# Patient Record
Sex: Male | Born: 1975 | Race: Black or African American | Hispanic: No | Marital: Single | Smoking: Never smoker
Health system: Southern US, Community
[De-identification: ages and names within clinical notes are randomized; demographics above are authoritative.]

## PROBLEM LIST (undated history)

## (undated) HISTORY — PX: APPENDECTOMY: SHX54

---

## 2005-08-06 ENCOUNTER — Emergency Department: Payer: Self-pay | Admitting: Emergency Medicine

## 2008-09-26 ENCOUNTER — Emergency Department: Payer: Self-pay | Admitting: Emergency Medicine

## 2010-03-17 ENCOUNTER — Emergency Department: Payer: Self-pay | Admitting: Emergency Medicine

## 2010-03-21 ENCOUNTER — Emergency Department: Payer: Self-pay | Admitting: Emergency Medicine

## 2010-03-25 ENCOUNTER — Ambulatory Visit: Payer: Self-pay | Admitting: Urology

## 2018-05-07 ENCOUNTER — Other Ambulatory Visit: Payer: Self-pay

## 2018-05-07 ENCOUNTER — Encounter: Payer: Self-pay | Admitting: Emergency Medicine

## 2018-05-07 ENCOUNTER — Emergency Department
Admission: EM | Admit: 2018-05-07 | Discharge: 2018-05-07 | Disposition: A | Discharge status: Home / Self Care | Payer: BLUE CROSS/BLUE SHIELD | Attending: Emergency Medicine | Admitting: Emergency Medicine

## 2018-05-07 ENCOUNTER — Emergency Department: Payer: BLUE CROSS/BLUE SHIELD

## 2018-05-07 DIAGNOSIS — R109 Unspecified abdominal pain: Secondary | ICD-10-CM | POA: Diagnosis present

## 2018-05-07 DIAGNOSIS — N201 Calculus of ureter: Secondary | ICD-10-CM | POA: Insufficient documentation

## 2018-05-07 LAB — URINALYSIS, COMPLETE (UACMP) WITH MICROSCOPIC
Bacteria, UA: NONE SEEN
Bilirubin Urine: NEGATIVE
Glucose, UA: NEGATIVE mg/dL
HGB URINE DIPSTICK: NEGATIVE
Ketones, ur: NEGATIVE mg/dL
LEUKOCYTES UA: NEGATIVE
Nitrite: NEGATIVE
PH: 7 (ref 5.0–8.0)
Protein, ur: NEGATIVE mg/dL
SPECIFIC GRAVITY, URINE: 1.017 (ref 1.005–1.030)
Squamous Epithelial / LPF: NONE SEEN (ref 0–5)
WBC, UA: NONE SEEN WBC/hpf (ref 0–5)

## 2018-05-07 LAB — COMPREHENSIVE METABOLIC PANEL
ALK PHOS: 70 U/L (ref 38–126)
ALT: 21 U/L (ref 0–44)
AST: 34 U/L (ref 15–41)
Albumin: 4.4 g/dL (ref 3.5–5.0)
Anion gap: 8 (ref 5–15)
BILIRUBIN TOTAL: 0.8 mg/dL (ref 0.3–1.2)
BUN: 12 mg/dL (ref 6–20)
CALCIUM: 8.9 mg/dL (ref 8.9–10.3)
CO2: 24 mmol/L (ref 22–32)
Chloride: 105 mmol/L (ref 98–111)
Creatinine, Ser: 1.54 mg/dL — ABNORMAL HIGH (ref 0.61–1.24)
GFR, EST NON AFRICAN AMERICAN: 54 mL/min — AB (ref >60)
Glucose, Bld: 121 mg/dL — ABNORMAL HIGH (ref 70–99)
Potassium: 3.4 mmol/L — ABNORMAL LOW (ref 3.5–5.1)
Sodium: 137 mmol/L (ref 135–145)
Total Protein: 7.5 g/dL (ref 6.5–8.1)

## 2018-05-07 LAB — CBC
HCT: 46.2 % (ref 40.0–52.0)
Hemoglobin: 15.9 g/dL (ref 13.0–18.0)
MCH: 29.9 pg (ref 26.0–34.0)
MCHC: 34.5 g/dL (ref 32.0–36.0)
MCV: 86.5 fL (ref 80.0–100.0)
PLATELETS: 199 10*3/uL (ref 150–440)
RBC: 5.34 MIL/uL (ref 4.40–5.90)
RDW: 14.4 % (ref 11.5–14.5)
WBC: 15.7 10*3/uL — AB (ref 3.8–10.6)

## 2018-05-07 MED ORDER — ONDANSETRON 4 MG PO TBDP: 4.0000 mg | ORAL_TABLET | Freq: Three times a day (TID) | ORAL | 0 refills | Status: AC | PRN

## 2018-05-07 MED ORDER — ONDANSETRON HCL 4 MG/2ML IJ SOLN
4.0000 mg | Freq: Once | INTRAMUSCULAR | Status: AC
  Administered 2018-05-07: 4 mg via INTRAVENOUS
  Filled 2018-05-07: qty 2

## 2018-05-07 MED ORDER — SODIUM CHLORIDE 0.9 % IV BOLUS
1000.0000 mL | Freq: Once | INTRAVENOUS | Status: AC
  Administered 2018-05-07: 1000 mL via INTRAVENOUS

## 2018-05-07 MED ORDER — HYDROMORPHONE HCL 1 MG/ML IJ SOLN
1.0000 mg | Freq: Once | INTRAMUSCULAR | Status: AC
  Administered 2018-05-07: 1 mg via INTRAVENOUS
  Filled 2018-05-07: qty 1

## 2018-05-07 MED ORDER — TAMSULOSIN HCL 0.4 MG PO CAPS: 0.4000 mg | ORAL_CAPSULE | Freq: Every day | ORAL | 0 refills | Status: AC

## 2018-05-07 MED ORDER — NAPROXEN 500 MG PO TABS: 500.0000 mg | ORAL_TABLET | Freq: Two times a day (BID) | ORAL | 2 refills | Status: AC

## 2018-05-07 MED ORDER — KETOROLAC TROMETHAMINE 30 MG/ML IJ SOLN
30.0000 mg | Freq: Once | INTRAMUSCULAR | Status: AC
  Administered 2018-05-07: 30 mg via INTRAVENOUS
  Filled 2018-05-07: qty 1

## 2018-05-07 MED ORDER — OXYCODONE-ACETAMINOPHEN 5-325 MG PO TABS: 1.0000 | ORAL_TABLET | ORAL | 0 refills | Status: AC | PRN

## 2018-05-07 NOTE — ED Notes (Signed)
Pt to CT

## 2018-05-07 NOTE — ED Provider Notes (Signed)
[facility] Emergency Department Provider Note   ____________________________________________    I have reviewed the triage vital signs and the nursing notes.   HISTORY  Chief Complaint Flank Pain     HPI Johnathan Mcguire is a 42 y.o. male who presents with abrupt onset left flank pain started last night and was continuous.  He reports the pain was quite severe and sharp in nature with radiation to his left groin.  He is never had this before.  He did not take anything for this.  Reports he has not had to urinate since the pain started.  Positive nausea and vomiting when he arrived in the emergency department.  No history of kidney stones.  No history of abdominal surgery except for an appendectomy   History reviewed. No pertinent past medical history.  There are no active problems to display for this patient.   Past Surgical History:  Procedure Laterality Date  . APPENDECTOMY      Prior to Admission medications   Medication Sig Start Date End Date Taking? Authorizing Provider  naproxen (NAPROSYN) 500 MG tablet Take 1 tablet (500 mg total) by mouth 2 (two) times daily with a meal. 05/07/18   Jene Every, MD  ondansetron (ZOFRAN ODT) 4 MG disintegrating tablet Take 1 tablet (4 mg total) by mouth every 8 (eight) hours as needed for nausea or vomiting. 05/07/18   Jene Every, MD  oxyCODONE-acetaminophen (PERCOCET) 5-325 MG tablet Take 1 tablet by mouth every 4 (four) hours as needed for severe pain. 05/07/18 05/07/19  Jene Every, MD  tamsulosin (FLOMAX) 0.4 MG CAPS capsule Take 1 capsule (0.4 mg total) by mouth daily. 05/07/18   Jene Every, MD     Allergies Patient has no known allergies.  No family history on file.  Social History Social History   Tobacco Use  . Smoking status: Never Smoker  . Smokeless tobacco: Never Used  Substance Use Topics  . Alcohol use: Never    Frequency: Never  . Drug use: Not on file    Review of  Systems  Constitutional: No fevers Eyes: No discharge ENT: No sore throat. Cardiovascular: Denies chest pain. Respiratory: Denies shortness of breath. Gastrointestinal: As above Genitourinary: Negative for dysuria. Musculoskeletal: Negative for back pain. Skin: Negative for rash. Neurological: Negative for headaches    ____________________________________________   PHYSICAL EXAM:  VITAL SIGNS: ED Triage Vitals  Enc Vitals Group     BP 05/07/18 0349 (!) 175/109     Pulse Rate 05/07/18 0349 73     Resp 05/07/18 0349 20     Temp 05/07/18 0349 (!) 97.4 F (36.3 C)     Temp Source 05/07/18 0349 Oral     SpO2 05/07/18 0349 99 %     Weight 05/07/18 0350 95.3 kg (210 lb)     Height 05/07/18 0350 1.778 m (5\' 10" )     Head Circumference --      Peak Flow --      Pain Score 05/07/18 0350 8     Pain Loc --      Pain Edu? --      Excl. in GC? --     Constitutional: Alert and oriented.    Nose: No congestion/rhinnorhea. Mouth/Throat: Mucous membranes are moist.    Cardiovascular: Normal rate, regular rhythm. Grossly normal heart sounds.  Good peripheral circulation. Respiratory: Normal respiratory effort.  No retractions. Lungs CTAB. Gastrointestinal: Soft and nontender. No distention.  Mild left CVA tenderness. Genitourinary:  deferred Musculoskeletal: No lower extremity tenderness nor edema.  Warm and well perfused Neurologic:  Normal speech and language. No gross focal neurologic deficits are appreciated.  Skin:  Skin is warm, dry and intact. No rash noted. Psychiatric: Mood and affect are normal. Speech and behavior are normal.  ____________________________________________   LABS (all labs ordered are listed, but only abnormal results are displayed)  Labs Reviewed  COMPREHENSIVE METABOLIC PANEL - Abnormal; Notable for the following components:      Result Value   Potassium 3.4 (*)    Glucose, Bld 121 (*)    Creatinine, Ser 1.54 (*)    GFR calc non Af Amer 54 (*)     All other components within normal limits  CBC - Abnormal; Notable for the following components:   WBC 15.7 (*)    All other components within normal limits  URINALYSIS, COMPLETE (UACMP) WITH MICROSCOPIC - Abnormal; Notable for the following components:   Color, Urine YELLOW (*)    APPearance CLEAR (*)    All other components within normal limits   ____________________________________________  EKG  None ____________________________________________  RADIOLOGY  CT renal stone study demonstrates 5 mm left UVJ stone ____________________________________________   PROCEDURES  Procedure(s) performed: No  Procedures   Critical Care performed: No ____________________________________________   INITIAL IMPRESSION / ASSESSMENT AND PLAN / ED COURSE  Pertinent labs & imaging results that were available during my care of the patient were reviewed by me and considered in my medical decision making (see chart for details).  Patient presents with left flank pain abrupt onset.  No history of kidney stones.  Treated with IV Dilaudid IV Zofran, IV fluids with significant improvement in pain.  White blood cell count is elevated at 15.7 likely related to kidney stone/pain, no evidence of infection thus far.  CT confirms stone  Pending urinalysis   ----------------------------------------- 9:55 AM on 05/07/2018 -----------------------------------------  Urinalysis unremarkable, patient's pain is controlled, return precautions discussed at length.    ____________________________________________   FINAL CLINICAL IMPRESSION(S) / ED DIAGNOSES  Final diagnoses:  Ureterolithiasis        Note:  This document was prepared using Dragon voice recognition software and may include unintentional dictation errors.    Jene Every, MD 05/07/18 [id number]

## 2018-05-07 NOTE — ED Triage Notes (Signed)
Pt presents to ED with left flank pain. Denies pain with urination. No hx of the same. Pt reports having nausea. Sudden onset around 1900 last night. Pt restless in triage.

## 2019-02-15 IMAGING — CT CT RENAL STONE PROTOCOL
2 of 4 series · 16 of 46 positions shown, 18 images · non-contrast
Comparison: None.

CLINICAL DATA: Left flank pain

EXAM:
CT ABDOMEN AND PELVIS WITHOUT CONTRAST
TECHNIQUE: Multidetector CT imaging of the abdomen and pelvis was performed
following the standard protocol without IV contrast.

[Series 2: stone full standard · axial · 0.76mm/px · z∈[-944,-484]mm · 13 of 100 slices shown, 15 images]
[im 4/100  soft-tissue]
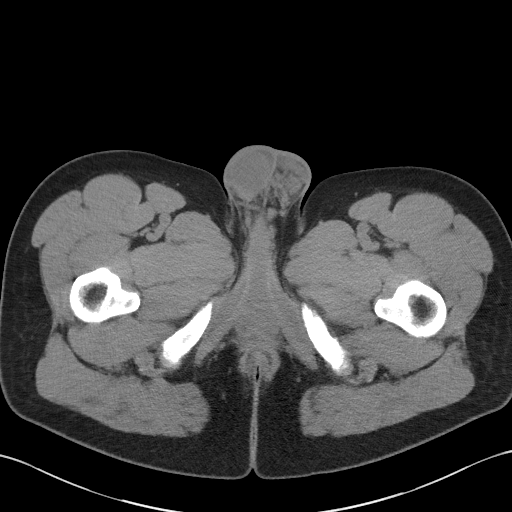
[im 4/100  bone]
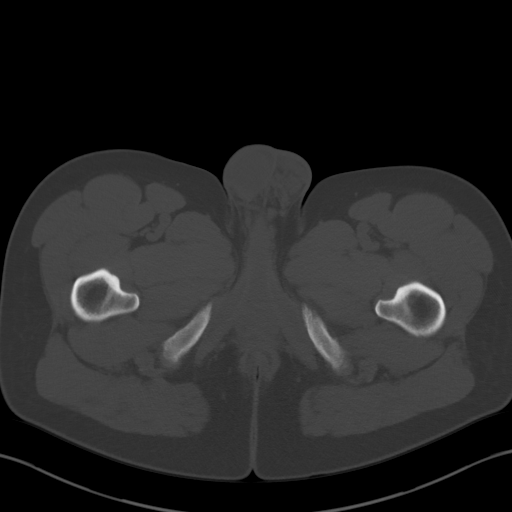
[im 12/100  soft-tissue]
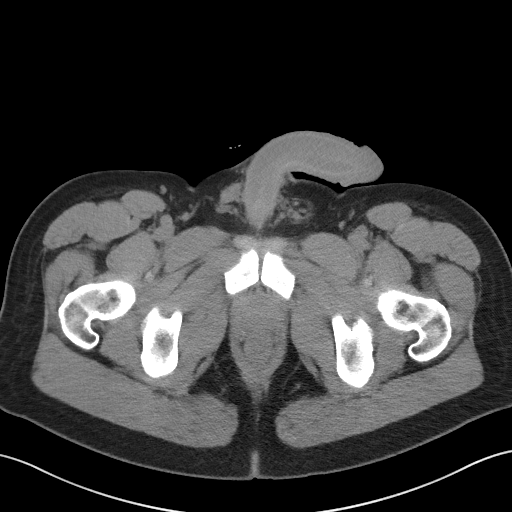
[im 20/100  soft-tissue]
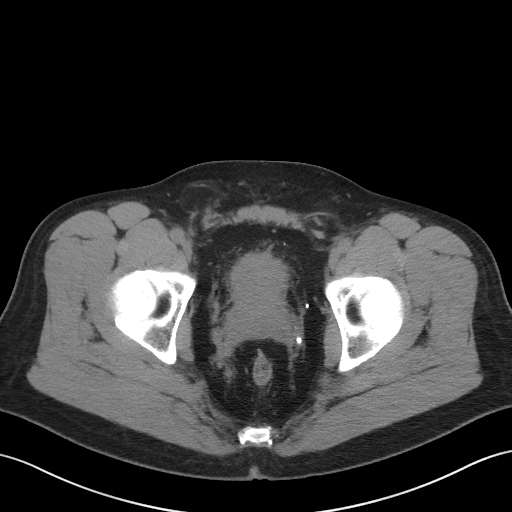
[im 28/100  soft-tissue]
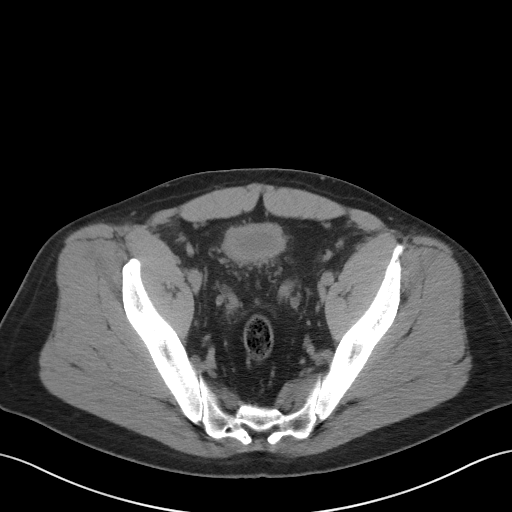
[im 36/100  soft-tissue]
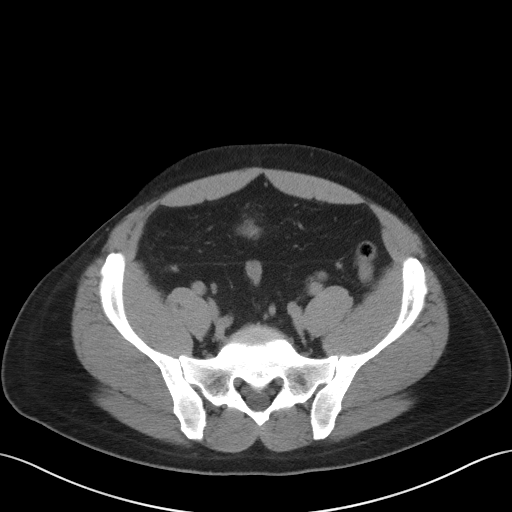
[im 44/100  soft-tissue]
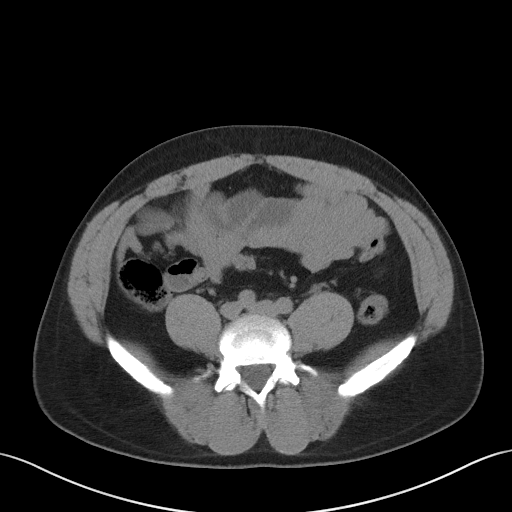
[im 52/100  soft-tissue]
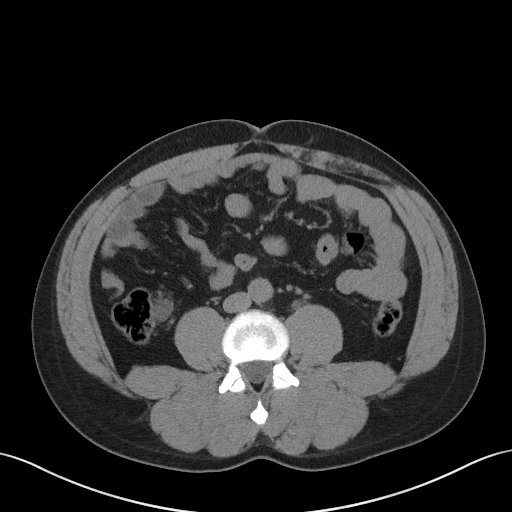
[im 56/100  soft-tissue]
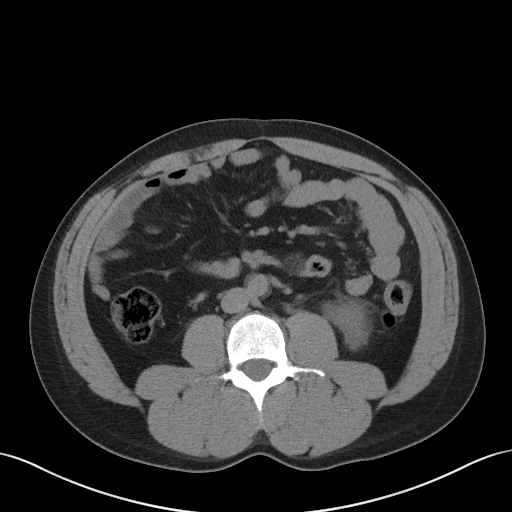
[im 64/100  soft-tissue]
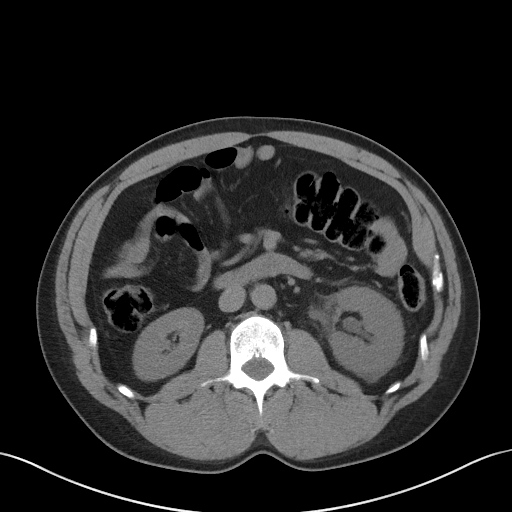
[im 64/100  bone]
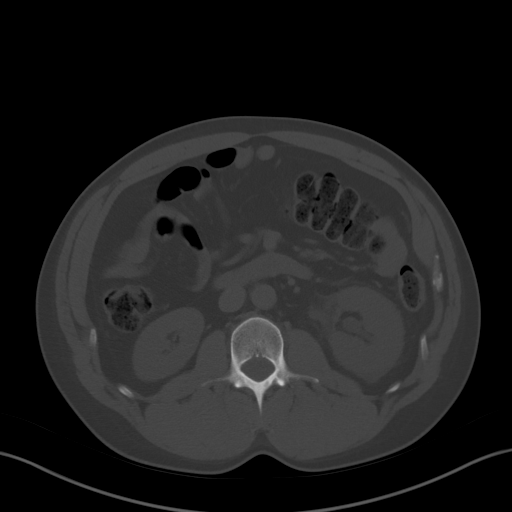
[im 72/100  soft-tissue]
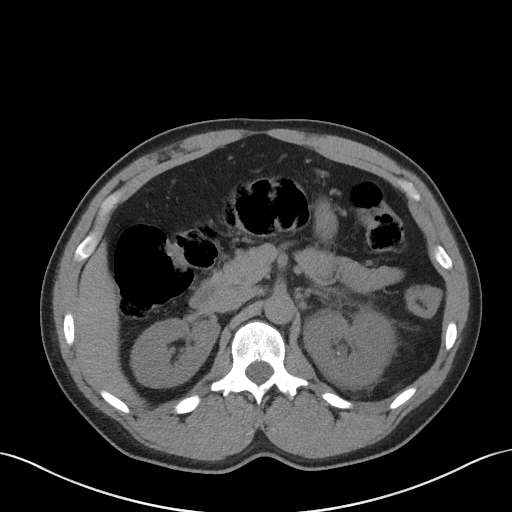
[im 80/100  soft-tissue]
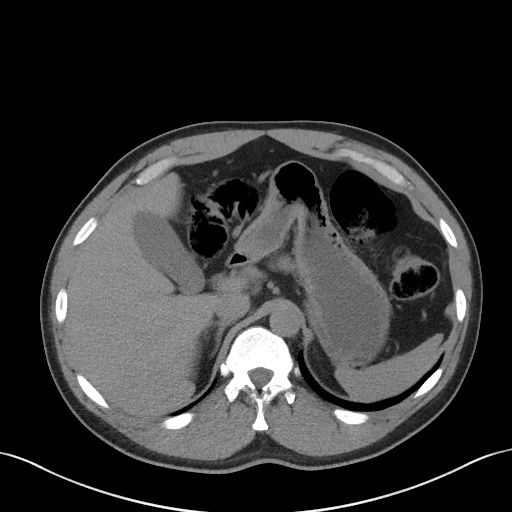
[im 88/100  soft-tissue]
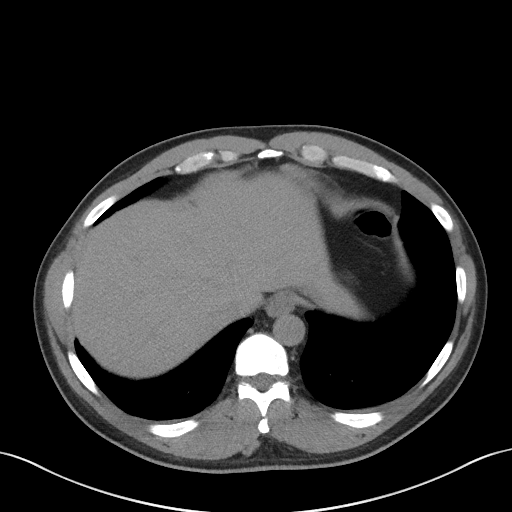
[im 96/100  soft-tissue]
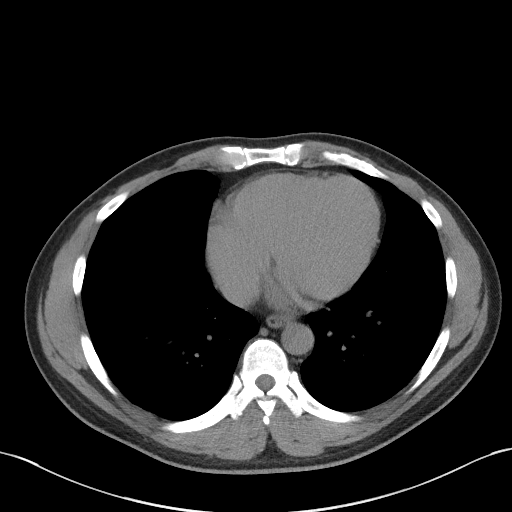

[Series 5: coronal · coronal · 0.77mm/px · 3 of 133 slices shown]
[im 45/133  soft-tissue]
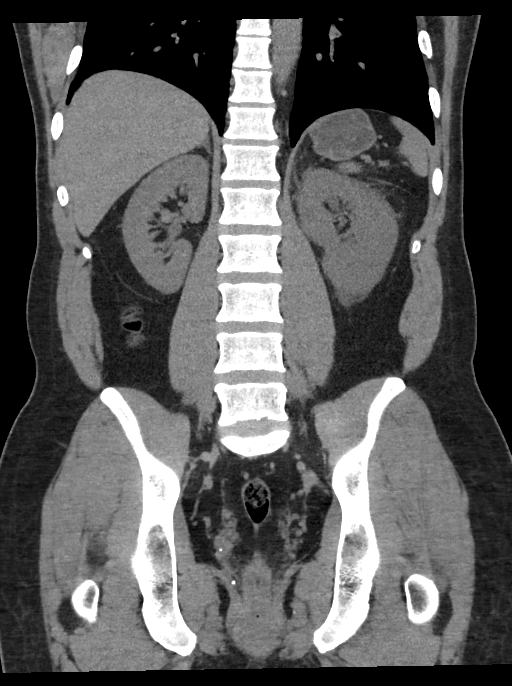
[im 59/133  soft-tissue]
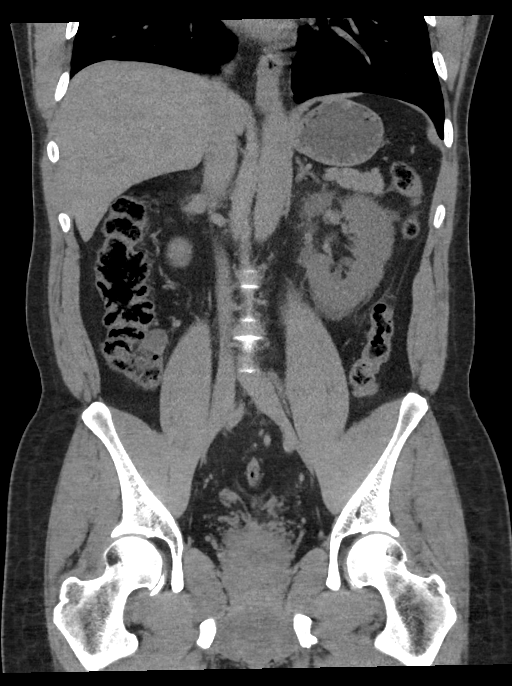
[im 74/133  soft-tissue]
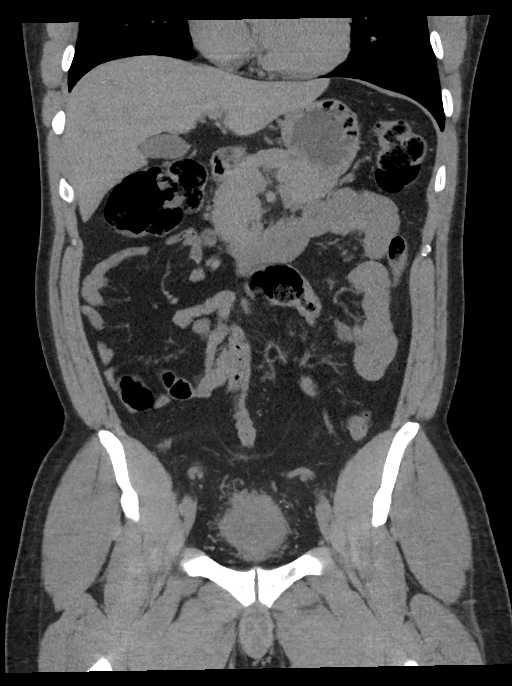

[16 of 46 positions shown; findings below may reference images not displayed]

FINDINGS: Lower chest: No acute abnormality.

Hepatobiliary: No focal liver abnormality is seen. No gallstones,
gallbladder wall thickening, or biliary dilatation.

Pancreas: Unremarkable. No pancreatic ductal dilatation or
surrounding inflammatory changes.

Spleen: Normal in size without focal abnormality.

Adrenals/Urinary Tract: Adrenal glands are within normal limits. The
right kidney is well visualized and within normal limits. No
obstructive changes are seen. The left kidney demonstrates a few
nonobstructing renal calculi. Significant perinephric stranding is
noted. Mild to moderate hydronephrosis and hydroureter is noted with
evidence of a distal left ureteral stone measuring 5 mm in
dimension. The bladder is decompressed and significant bladder wall
thickening is noted. The possibility of superimposed UTI could not
be totally excluded.

Stomach/Bowel: The appendix is not well visualized although no
inflammatory changes to suggest appendicitis are noted. No
obstructive or inflammatory changes of the bowel are seen.

Vascular/Lymphatic: No significant vascular findings are present. No
enlarged abdominal or pelvic lymph nodes.

Reproductive: Prostate is unremarkable.

Other: No ascites is noted. Small fat containing inguinal hernias
are noted bilaterally right slightly greater than left.

Musculoskeletal: No acute or significant osseous findings.
IMPRESSION: 5 mm distal left ureteral stone with hydronephrosis and hydroureter
and significant increased perinephric stranding.

Decompressed bladder although the wall is thickened and irregular
greater than that expected for simple decompression. Underlying UTI
could not be totally excluded.

Bilateral fat containing inguinal hernias.

## 2019-05-11 ENCOUNTER — Ambulatory Visit: Payer: Self-pay | Admitting: Family Medicine

## 2019-05-11 ENCOUNTER — Other Ambulatory Visit: Payer: Self-pay

## 2019-05-11 ENCOUNTER — Encounter: Payer: Self-pay | Admitting: Family Medicine

## 2019-05-11 DIAGNOSIS — Z113 Encounter for screening for infections with a predominantly sexual mode of transmission: Secondary | ICD-10-CM

## 2019-05-11 DIAGNOSIS — Z202 Contact with and (suspected) exposure to infections with a predominantly sexual mode of transmission: Secondary | ICD-10-CM

## 2019-05-11 LAB — GRAM STAIN

## 2019-05-11 MED ORDER — METRONIDAZOLE 500 MG PO TABS: 2000.0000 mg | ORAL_TABLET | Freq: Once | ORAL | Status: DC

## 2019-05-11 MED ORDER — METRONIDAZOLE 500 MG PO TABS: 2000.0000 mg | ORAL_TABLET | Freq: Once | ORAL | 0 refills | Status: AC

## 2019-05-11 NOTE — Progress Notes (Signed)
    STI clinic/screening visit  Subjective:  Johnathan Mcguire is a 43 y.o. male being seen today for an STI screening visit. The patient reports they do not have symptoms.  Patient has the following medical conditions:  There are no active problems to display for this patient.    Chief Complaint  Patient presents with  . SEXUALLY TRANSMITTED DISEASE    STD screening    HPI  Patient reports here for STD screening and treatment for contact to trich.  Client denies sympts  See flowsheet for further details and programmatic requirements.    The following portions of the patient's history were reviewed and updated as appropriate: allergies, current medications, past medical history, past social history, past surgical history and problem list.  Objective:  There were no vitals filed for this visit.  Physical Exam HENT:     Mouth/Throat:     Pharynx: No oropharyngeal exudate or posterior oropharyngeal erythema.  Abdominal:     Palpations: Abdomen is soft.     Tenderness: There is no abdominal tenderness.  Genitourinary:    Penis: Normal.      Scrotum/Testes: Normal.     Comments: No inguinal adenopathy Lymphadenopathy:     Cervical: No cervical adenopathy.  Skin:    General: Skin is warm.     Findings: No rash.  Neurological:     Mental Status: He is alert.       Assessment and Plan:  Johnathan Mcguire is a 43 y.o. male presenting to the [facility] Department for STI screening  1. Screening examination for venereal disease  - Gram stain - Gonococcus culture  2. Venereal disease contact Trich - metroNIDAZOLE (FLAGYL) tablet 2,000 mg Co to avoid sexual activity x 1 wk  Co to use condoms always.  No follow-ups on file.  No future appointments.  Hassell Done, FNP

## 2019-05-11 NOTE — Progress Notes (Signed)
Pt here for STD screening but declines blood work today for HIV and syphillis.Ronny Bacon, RN

## 2019-05-11 NOTE — Progress Notes (Signed)
Gram stain reviewed and no treatment needed for gram stain per standing order. Pt treated as contact to Trich per Hassell Done, FNP written and verbal order. Provider orders completed.Ronny Bacon, RN

## 2019-05-16 LAB — GONOCOCCUS CULTURE

## 2019-05-24 ENCOUNTER — Telehealth: Payer: Self-pay | Admitting: Family Medicine

## 2019-05-24 NOTE — Telephone Encounter (Signed)
TC with patient. Verified ID via password. Discussed neg GC cx. Patient did not have blood work done at visit. No there concerns per patient.  Aileen Fass, RN

## 2019-05-24 NOTE — Telephone Encounter (Signed)
Patient wants STD results. 

## 2019-11-04 ENCOUNTER — Ambulatory Visit: Payer: BLUE CROSS/BLUE SHIELD | Attending: Internal Medicine
